# Patient Record
Sex: Male | Born: 1935 | Race: White | Hispanic: No | Smoking: Former smoker
Health system: Southern US, Community
[De-identification: ages and names within clinical notes are randomized; demographics above are authoritative.]

## PROBLEM LIST (undated history)

## (undated) DIAGNOSIS — E079 Disorder of thyroid, unspecified: Secondary | ICD-10-CM

## (undated) DIAGNOSIS — K2270 Barrett's esophagus without dysplasia: Secondary | ICD-10-CM

## (undated) HISTORY — PX: UVULOPALATOPHARYNGOPLASTY: SHX827

---

## 2017-09-17 ENCOUNTER — Emergency Department (INDEPENDENT_AMBULATORY_CARE_PROVIDER_SITE_OTHER): Payer: Medicare Other

## 2017-09-17 ENCOUNTER — Emergency Department
Admission: EM | Admit: 2017-09-17 | Discharge: 2017-09-17 | Disposition: A | Discharge status: Home / Self Care | Payer: Medicare Other | Source: Home / Self Care | Attending: Family Medicine | Admitting: Family Medicine

## 2017-09-17 ENCOUNTER — Encounter: Payer: Self-pay | Admitting: Emergency Medicine

## 2017-09-17 ENCOUNTER — Other Ambulatory Visit: Payer: Self-pay

## 2017-09-17 DIAGNOSIS — X58XXXA Exposure to other specified factors, initial encounter: Secondary | ICD-10-CM | POA: Diagnosis not present

## 2017-09-17 DIAGNOSIS — S2231XA Fracture of one rib, right side, initial encounter for closed fracture: Secondary | ICD-10-CM | POA: Diagnosis not present

## 2017-09-17 HISTORY — DX: Disorder of thyroid, unspecified: E07.9

## 2017-09-17 HISTORY — DX: Barrett's esophagus without dysplasia: K22.70

## 2017-09-17 NOTE — ED Provider Notes (Signed)
[facility]    CSN: [id number] Arrival date & time: 09/17/17  0815     History   Chief Complaint Chief Complaint  Patient presents with  . Back Pain    HPI Andrew Waters is a 84 y.o. male.   Patient complains of two week history of right posterior/inferior chest/rib pain, worse with cough although he does not have a cough and denies recent URI.  The pain improves when he applies pressure to his right posterior chest, but is not painful with palpation.  No shortness of breath.  No fevers, chills, and sweats.  No rash.  No urinary symptoms.  He reports occasional wheezing.  He has a past history of smoking.  The pain improves somewhat with ibuprofen and Tylenol.  He recalls no chest injury.   The history is provided by the patient and a relative.  Back Pain  Pain location: right posterior chest. Quality:  Aching Radiates to:  Does not radiate Pain severity:  Mild Pain is:  Same all the time Onset quality:  Gradual Duration:  2 weeks Timing:  Intermittent Progression:  Unchanged Chronicity:  New Context: not recent illness and not recent injury   Relieved by: applying pressure. Worsened by:  Coughing Ineffective treatments:  None tried Associated symptoms: no abdominal pain, no dysuria, no fever, no numbness and no paresthesias   Risk factors comment:  History of smoking   Past Medical History:  Diagnosis Date  . Barrett's esophagus   . Thyroid disease     There are no active problems to display for this patient.   Past Surgical History:  Procedure Laterality Date  . UVULOPALATOPHARYNGOPLASTY         Home Medications    Prior to Admission medications   Medication Sig Start Date End Date Taking? Authorizing Provider  Alfalfa 250 MG TABS Take by mouth.   Yes [provider]  Coral Calcium 1000 (390 Ca) MG TABS Take by mouth.   Yes [provider]  levothyroxine (SYNTHROID, LEVOTHROID) 112 MCG tablet Take 112 mcg by mouth daily  before breakfast.   Yes [provider]  Multiple Vitamins-Minerals (RA VISION-VITE PRESERVE PO) Take by mouth.   Yes [provider]  pantoprazole (PROTONIX) 40 MG tablet Take 40 mg by mouth daily.   Yes [provider]  Vitamin D, Ergocalciferol, (DRISDOL) 50000 units CAPS capsule Take 50,000 Units by mouth every 7 (seven) days.   Yes [provider]  vitamin E 100 UNIT capsule Take by mouth daily.   Yes [provider]    Family History Family History  Problem Relation Age of Onset  . Diabetes Father     Social History Social History   Tobacco Use  . Smoking status: Former Smoker    Types: Cigarettes    Last attempt to quit: 2005    Years since quitting: 14.0  . Smokeless tobacco: Never Used  Substance Use Topics  . Alcohol use: No    Frequency: Never  . Drug use: No     Allergies   Patient has no known allergies.   Review of Systems Review of Systems  Constitutional: Negative for activity change, appetite change, chills, diaphoresis, fatigue, fever and unexpected weight change.  HENT: Positive for postnasal drip. Negative for sinus pressure.   Eyes: Negative.   Respiratory: Positive for wheezing. Negative for cough, chest tightness, shortness of breath and stridor.   Cardiovascular: Negative for palpitations and leg swelling.  Gastrointestinal: Negative for abdominal pain  and nausea.  Genitourinary: Negative for dysuria and hematuria.  Musculoskeletal: Positive for back pain.  Skin: Negative for rash.  Neurological: Negative for numbness and paresthesias.     Physical Exam Triage Vital Signs ED Triage Vitals  Enc Vitals Group     BP 09/17/17 0846 (!) 158/75     Pulse Rate 09/17/17 0846 70     Resp --      Temp 09/17/17 0846 (!) 97.5 F (36.4 C)     Temp Source 09/17/17 0846 Oral     SpO2 09/17/17 0846 92 %     Weight 09/17/17 0847 189 lb (85.7 kg)     Height 09/17/17 0847 5\' 11"  (1.803 m)     Head  Circumference --      Peak Flow --      Pain Score 09/17/17 0847 3     Pain Loc --      Pain Edu? --      Excl. in GC? --    No data found.  Updated Vital Signs BP (!) 158/75 (BP Location: Right Arm)   Pulse 70   Temp (!) 97.5 F (36.4 C) (Oral)   Ht 5\' 11"  (1.803 m)   Wt 189 lb (85.7 kg)   SpO2 92%   BMI 26.36 kg/m   Visual Acuity Right Eye Distance:   Left Eye Distance:   Bilateral Distance:    Right Eye Near:   Left Eye Near:    Bilateral Near:     Physical Exam  Constitutional: He appears well-developed and well-nourished. No distress.  HENT:  Head: Normocephalic.  Right Ear: External ear normal.  Left Ear: External ear normal.  Nose: Nose normal.  Mouth/Throat: Oropharynx is clear and moist.  Eyes: Conjunctivae are normal. Pupils are equal, round, and reactive to light.  Neck: Neck supple.  Cardiovascular: Normal rate and normal heart sounds.  Pulmonary/Chest: Effort normal. No stridor. No respiratory distress. He has wheezes. He has no rales.     He exhibits no tenderness, no bony tenderness, no crepitus and no swelling.  Few faint bibasilar posterior expiratory wheezes heard.  Patient's area of pain outlined on diagram but there is no tenderness to palpation in this location.  Abdominal: There is no tenderness.  Musculoskeletal: He exhibits no edema.  Lymphadenopathy:    He has no cervical adenopathy.  Neurological: He is alert.  Skin: Skin is warm and dry. No rash noted.  Nursing note and vitals reviewed.    UC Treatments / Results  Labs (all labs ordered are listed, but only abnormal results are displayed) Labs Reviewed - No data to display  EKG  EKG Interpretation None       Radiology Dg Chest 2 View  Result Date: 09/17/2017 CLINICAL DATA:  Right rib pain for 2 weeks. EXAM: CHEST  2 VIEW COMPARISON:  None. FINDINGS: The heart size and mediastinal contours are within normal limits. No pneumothorax or pleural effusion is noted. Mild  left basilar subsegmental atelectasis or scarring is noted. Moderately displaced right seventh rib fracture is noted. IMPRESSION: Mildly displaced right seventh rib fracture. Mild left basilar subsegmental atelectasis or scarring is noted. Electronically Signed   By: Lupita RaiderJames  Green Jr, M.D.   On: 09/17/2017 09:13    Procedures Procedures (including critical care time)  Medications Ordered in UC Medications - No data to display   Initial Impression / Assessment and Plan / UC Course  I have reviewed the triage vital signs and the nursing notes.  Pertinent labs & imaging results that were available during my care of the patient were reviewed by me and considered in my medical decision making (see chart for details).    Rib belt dispensed. Do not wear rib belt continuously.  Discontinue the rib belt if you develop a cold.  May take Tylenol as needed for pain. If symptoms become significantly worse during the night or over the weekend, proceed to the local emergency room.  Followup with Family Doctor if not improving. Patient notes that he already takes adequate vitamin D and calcium.    Final Clinical Impressions(s) / UC Diagnoses   Final diagnoses:  Closed fracture of one rib of right side, initial encounter    ED Discharge Orders    None           Lattie Haw, MD 09/17/17 [phone]

## 2017-09-17 NOTE — ED Triage Notes (Signed)
Rt back rib pain x 2 weeks, getting worse, denies injury 3-8

## 2017-09-17 NOTE — Discharge Instructions (Signed)
Do not wear rib belt continuously.  Discontinue the rib belt if you develop a cold.  May take Tylenol as needed for pain. If symptoms become significantly worse during the night or over the weekend, proceed to the local emergency room.

## 2017-09-24 ENCOUNTER — Emergency Department (INDEPENDENT_AMBULATORY_CARE_PROVIDER_SITE_OTHER): Payer: Medicare Other

## 2017-09-24 ENCOUNTER — Encounter: Payer: Self-pay | Admitting: *Deleted

## 2017-09-24 ENCOUNTER — Emergency Department
Admission: EM | Admit: 2017-09-24 | Discharge: 2017-09-24 | Disposition: A | Discharge status: Home / Self Care | Payer: Medicare Other | Source: Home / Self Care | Attending: Family Medicine | Admitting: Family Medicine

## 2017-09-24 ENCOUNTER — Other Ambulatory Visit: Payer: Self-pay

## 2017-09-24 DIAGNOSIS — R509 Fever, unspecified: Secondary | ICD-10-CM

## 2017-09-24 DIAGNOSIS — M545 Low back pain, unspecified: Secondary | ICD-10-CM

## 2017-09-24 DIAGNOSIS — R05 Cough: Secondary | ICD-10-CM

## 2017-09-24 DIAGNOSIS — R062 Wheezing: Secondary | ICD-10-CM

## 2017-09-24 DIAGNOSIS — R0602 Shortness of breath: Secondary | ICD-10-CM | POA: Diagnosis not present

## 2017-09-24 NOTE — ED Triage Notes (Signed)
Pt c/o LT back/rib area pain today with some SOB and cough, and temp 99.6. Took Tylenol extra strength for his RT rib fx at 1230pm today.

## 2017-09-24 NOTE — ED Provider Notes (Signed)
[facility]    CSN: [id number] Arrival date & time: 09/24/17  1535     History   Chief Complaint Chief Complaint  Patient presents with  . Back Pain    HPI Burney GauzeJames Drohan is a 84 y.o. male.   HPI  Burney GauzeJames Froning is a 84 y.o. male presenting to UC with c/o Left side mid to lower back pain that started today with SOB and mild intermittent dry cough.  Pain is sharp at times, nothing seems to make it better or worse as he can get the pain even when sitting, resting.  Temp of 99.6*F earlier today.  He took Tylenol Extra Strength for Right rib fracture pain at 12:30PM today.   [facility], dx with a Right rib fracture, however, no specific cause identified. Pt did not have any injuries. He had coughed some and sneezed but denies hx of osteoporosis or osteopenia.  No known hx of cancer.  No rashes or bruising.  Daughter is concerned he developed a low-grade fever and SOB this morning.  Pt is typically very healthy and even plays golf when its warm enough.   No hx of blood clots, leg pain or swelling or recent travel. No known heart problems.   Past Medical History:  Diagnosis Date  . Barrett's esophagus   . Thyroid disease     There are no active problems to display for this patient.   Past Surgical History:  Procedure Laterality Date  . UVULOPALATOPHARYNGOPLASTY         Home Medications    Prior to Admission medications   Medication Sig Start Date End Date Taking? Authorizing Provider  Alfalfa 250 MG TABS Take by mouth.    [provider]  Coral Calcium 1000 (390 Ca) MG TABS Take by mouth.    [provider]  levothyroxine (SYNTHROID, LEVOTHROID) 112 MCG tablet Take 112 mcg by mouth daily before breakfast.    [provider]  Multiple Vitamins-Minerals (RA VISION-VITE PRESERVE PO) Take by mouth.    [provider]  pantoprazole (PROTONIX) 40 MG tablet Take 40 mg by mouth daily.    [provider]  Vitamin D, Ergocalciferol, (DRISDOL) 50000 units CAPS capsule Take 50,000 Units by mouth every 7 (seven) days.    [provider]  vitamin E 100 UNIT capsule Take by mouth daily.    [provider]    Family History Family History  Problem Relation Age of Onset  . Diabetes Father     Social History Social History   Tobacco Use  . Smoking status: Former Smoker    Types: Cigarettes    Last attempt to quit: 2005    Years since quitting: 14.0  . Smokeless tobacco: Never Used  Substance Use Topics  . Alcohol use: No    Frequency: Never  . Drug use: No     Allergies   Patient has no known allergies.   Review of Systems Review of Systems  Constitutional: Negative for appetite change, chills, diaphoresis, fatigue and fever.  Respiratory: Positive for shortness of breath. Negative for chest tightness and wheezing.   Cardiovascular: Negative for chest pain and palpitations.  Musculoskeletal: Positive for back pain and myalgias. Negative for neck pain and neck stiffness.  Skin: Negative for color change, rash and wound.  Neurological: Negative for dizziness, weakness, light-headedness, numbness and headaches.     Physical Exam Triage Vital Signs ED Triage Vitals  Enc Vitals Group  BP 09/24/17 1554 (!) 152/65     Pulse Rate 09/24/17 1554 94     Resp 09/24/17 1554 18     Temp 09/24/17 1554 98.2 F (36.8 C)     Temp Source 09/24/17 1554 Oral     SpO2 09/24/17 1554 92 %     Weight 09/24/17 1555 192 lb (87.1 kg)     Height 09/24/17 1555 5\' 11"  (1.803 m)     Head Circumference --      Peak Flow --      Pain Score 09/24/17 1554 4     Pain Loc --      Pain Edu? --      Excl. in GC? --    No data found.  Updated Vital Signs BP (!) 152/65 (BP Location: Right Arm)   Pulse 94   Temp 98.2 F (36.8 C) (Oral)   Resp 18   Ht 5\' 11"  (1.803 m)   Wt 192 lb (87.1 kg)   SpO2 92%   BMI 26.78 kg/m    Physical Exam  Constitutional: He is  oriented to person, place, and time. He appears well-developed and well-nourished. No distress.  HENT:  Head: Normocephalic and atraumatic.  Mouth/Throat: Oropharynx is clear and moist.  Eyes: EOM are normal.  Neck: Normal range of motion. Neck supple.  Cardiovascular: Normal rate and regular rhythm.  Pulmonary/Chest: Effort normal and breath sounds normal. No stridor. No respiratory distress. He has no wheezes. He has no rales.     He exhibits no tenderness.  Areas of pain when coughing or even at rest but not tender with palpation.  Musculoskeletal: Normal range of motion.  Lymphadenopathy:    He has no cervical adenopathy.  Neurological: He is alert and oriented to person, place, and time.  Skin: Skin is warm and dry. He is not diaphoretic.  Psychiatric: He has a normal mood and affect. His behavior is normal.  Nursing note and vitals reviewed.    UC Treatments / Results  Labs (all labs ordered are listed, but only abnormal results are displayed) Labs Reviewed - No data to display  EKG  EKG Interpretation None       Radiology Dg Ribs Unilateral W/chest Left  Result Date: 09/24/2017 CLINICAL DATA:  Cough, wheezing, and shortness of breath. EXAM: LEFT RIBS AND CHEST - 3+ VIEW COMPARISON:  Two-view chest x-ray 09/17/2017. FINDINGS: The heart size is normal. Interstitial coarsening is stable. There is no edema or effusion. Acute or healing left-sided rib fractures are present. There is a focal density associated with the seventh posterolateral right rib. This likely represents callus associated with a healing fracture. IMPRESSION: 1. No acute cardiopulmonary disease. 2. No acute or healing left-sided rib lesions. 3. Probable callus about the posterolateral right seventh rib. Oblique radiographs or CT could be used for further evaluation. Electronically Signed   By: Marin Roberts M.D.   On: 09/24/2017 16:18    Procedures Procedures (including critical care  time)  Medications Ordered in UC Medications - No data to display   Initial Impression / Assessment and Plan / UC Course  I have reviewed the triage vital signs and the nursing notes.  Pertinent labs & imaging results that were available during my care of the patient were reviewed by me and considered in my medical decision making (see chart for details).     O2 Sat on RA 92%, same as it was on 09/17/17. Lungs: CTAB CXR: no new findings, however, radiologist recommends CT  for further evaluation. Consulted with Dr. Cathren Harsh, agrees to have pt go to ED for further evaluation with CT, pt would benefit most from contrast to r/o PE Pt and daughter agreeable with tx plan. Plan to go directly to [facility] Emergency Department as pt has been there previously. Pt in good condition to go POV.  Final Clinical Impressions(s) / UC Diagnoses   Final diagnoses:  Acute left-sided low back pain without sciatica  Shortness of breath  Low grade fever    ED Discharge Orders    None       Controlled Substance Prescriptions [place] Controlled Substance Registry consulted? Not Applicable   Rolla Plate 09/24/17 1702

## 2017-11-01 DEATH — deceased

## 2018-12-03 IMAGING — DX DG RIBS W/ CHEST 3+V*L*
4 series · 4 of 4 positions shown · non-contrast
Comparison: Two-view chest x-ray 09/17/2017.

CLINICAL DATA: Cough, wheezing, and shortness of breath.

EXAM:
LEFT RIBS AND CHEST - 3+ VIEW

[chest pa]
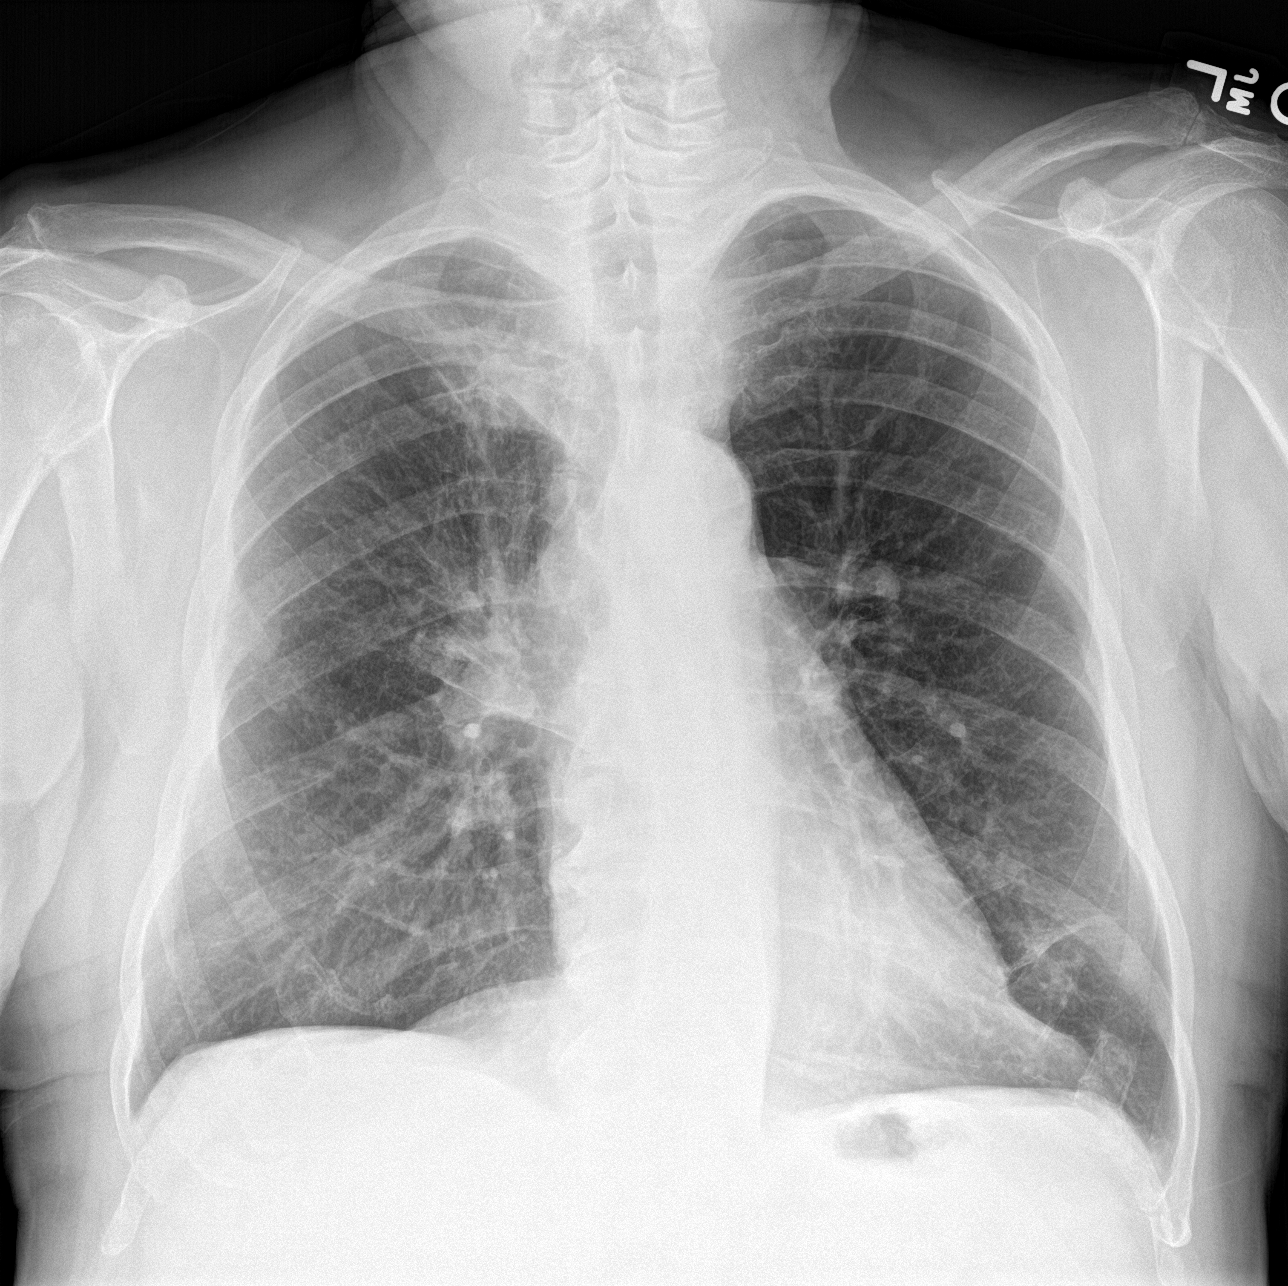

[rib ap (1 of 2)]
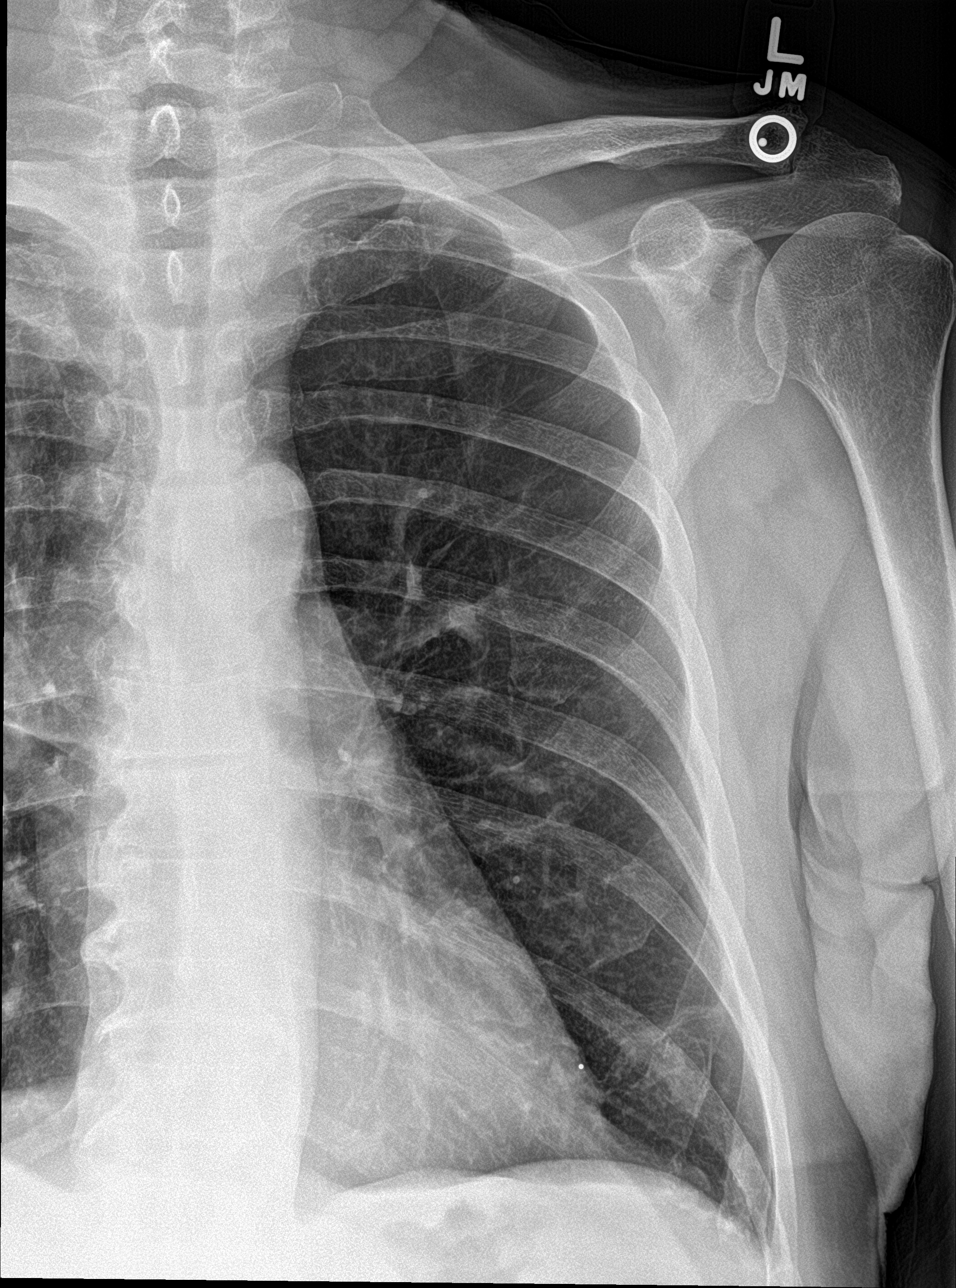

[rib ap (2 of 2)]
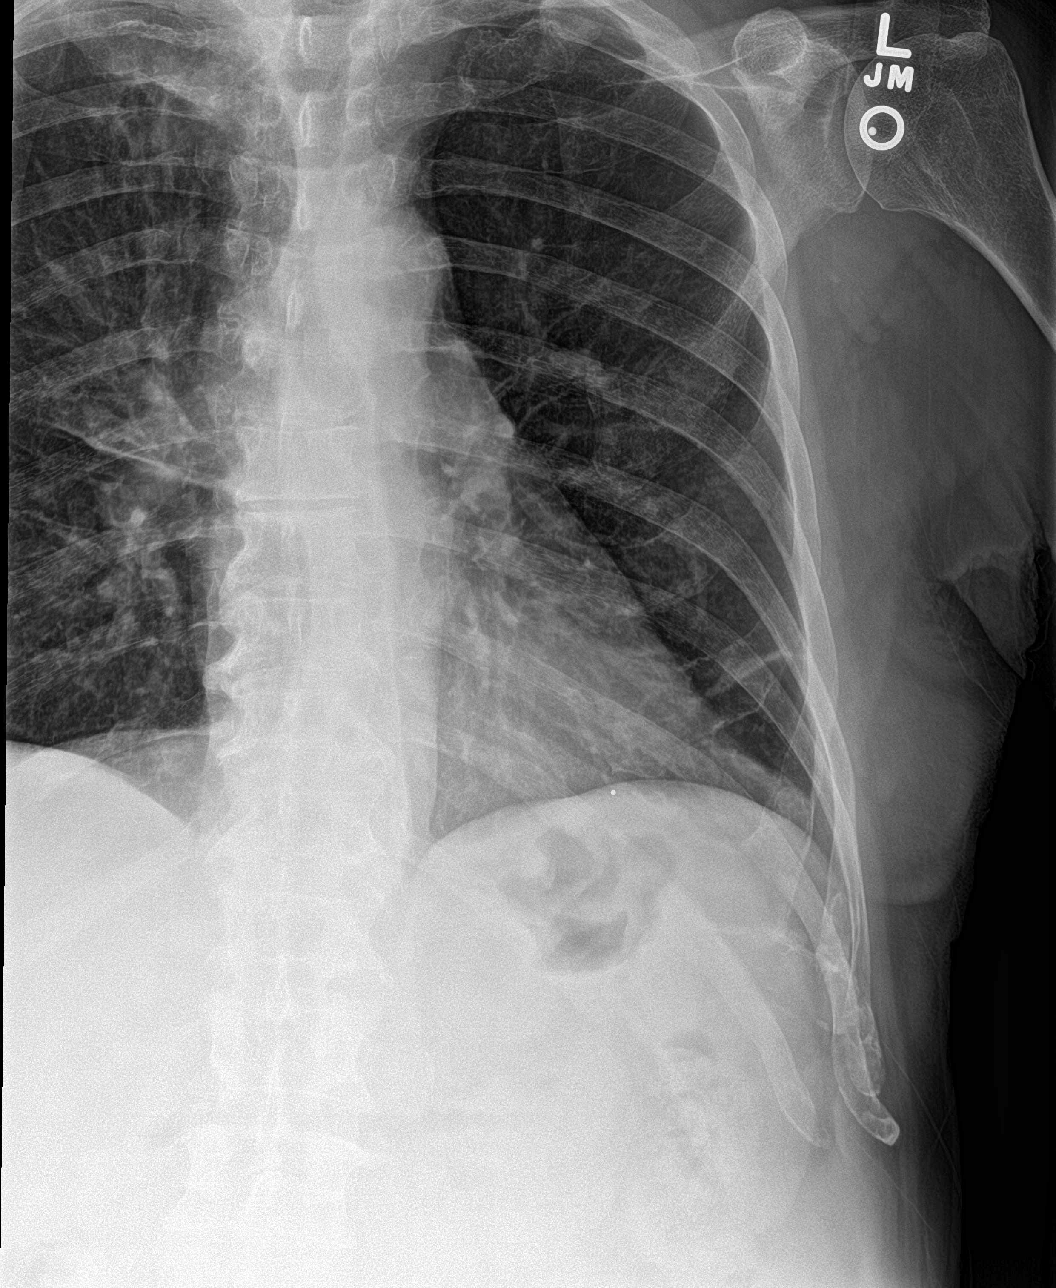

[rib ap obl]
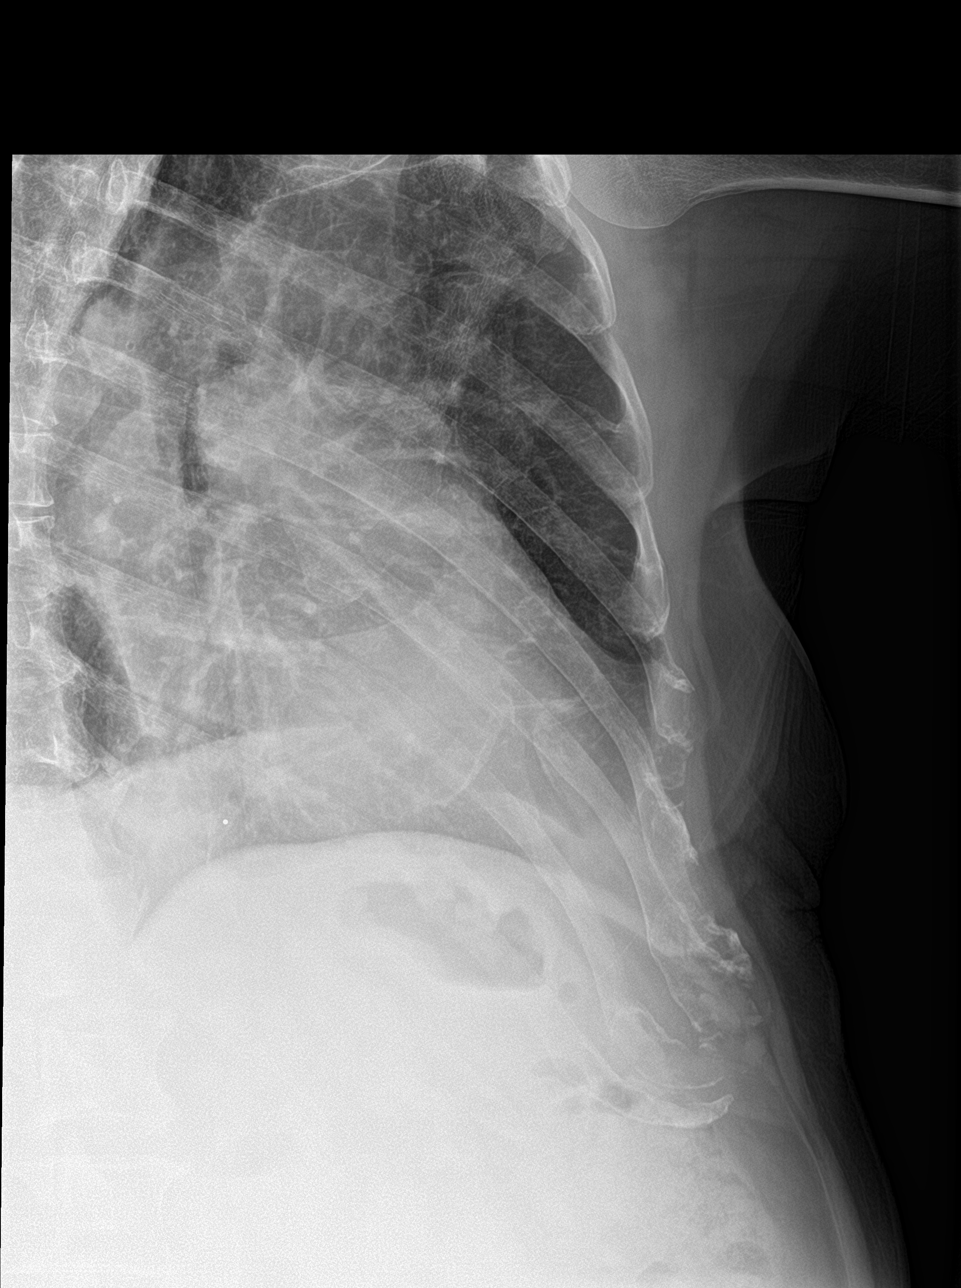

[4 of 4 positions shown; findings below may reference images not displayed]

FINDINGS: The heart size is normal. Interstitial coarsening is stable. There
is no edema or effusion. Acute or healing left-sided rib fractures
are present. There is a focal density associated with the seventh
posterolateral right rib. This likely represents callus associated
with a healing fracture.
IMPRESSION: 1. No acute cardiopulmonary disease.
2. No acute or healing left-sided rib lesions.
3. Probable callus about the posterolateral right seventh rib.
Oblique radiographs or CT could be used for further evaluation.
